# Patient Record
Sex: Male | Born: 1960
Health system: Southern US, Community
[De-identification: ages and names within clinical notes are randomized; demographics above are authoritative.]

## PROBLEM LIST (undated history)

## (undated) DIAGNOSIS — I10 Essential (primary) hypertension: Secondary | ICD-10-CM

## (undated) DIAGNOSIS — E559 Vitamin D deficiency, unspecified: Secondary | ICD-10-CM

## (undated) DIAGNOSIS — T7840XA Allergy, unspecified, initial encounter: Secondary | ICD-10-CM

## (undated) DIAGNOSIS — M1990 Unspecified osteoarthritis, unspecified site: Secondary | ICD-10-CM

## (undated) HISTORY — DX: Essential (primary) hypertension: I10

## (undated) HISTORY — DX: Allergy, unspecified, initial encounter: T78.40XA

## (undated) HISTORY — DX: Unspecified osteoarthritis, unspecified site: M19.90

## (undated) HISTORY — DX: Vitamin D deficiency, unspecified: E55.9

---

## 1998-02-28 ENCOUNTER — Ambulatory Visit (HOSPITAL_COMMUNITY): Admission: RE | Admit: 1998-02-28 | Discharge: 1998-02-28 | Payer: Self-pay | Admitting: Internal Medicine

## 2001-06-24 ENCOUNTER — Encounter: Payer: Self-pay | Admitting: Internal Medicine

## 2001-06-24 ENCOUNTER — Encounter: Admission: RE | Admit: 2001-06-24 | Discharge: 2001-06-24 | Payer: Self-pay | Admitting: Internal Medicine

## 2004-09-02 ENCOUNTER — Encounter: Admission: RE | Admit: 2004-09-02 | Discharge: 2004-09-02 | Payer: Self-pay | Admitting: Internal Medicine

## 2006-10-27 ENCOUNTER — Encounter: Admission: RE | Admit: 2006-10-27 | Discharge: 2006-10-27 | Payer: Self-pay | Admitting: Internal Medicine

## 2009-02-28 ENCOUNTER — Encounter: Admission: RE | Admit: 2009-02-28 | Discharge: 2009-02-28 | Payer: Self-pay | Admitting: Internal Medicine

## 2010-06-18 IMAGING — CR DG CERVICAL SPINE COMPLETE 4+V
6 series · 6 of 6 positions shown · non-contrast
Comparison: 09/02/2004.

CLINICAL DATA: Neck pain

CERVICAL SPINE - COMPLETE 4+ VIEW

[w c-spine lat]
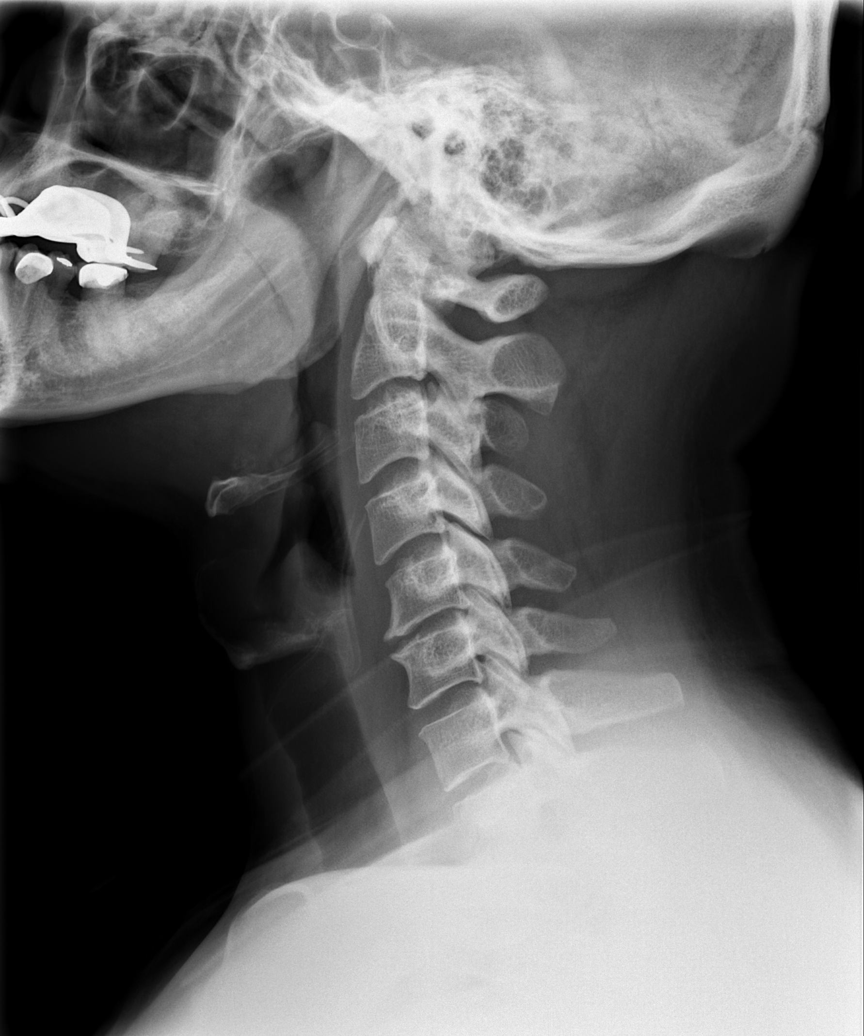

[w c-spine oblique (1 of 2)]
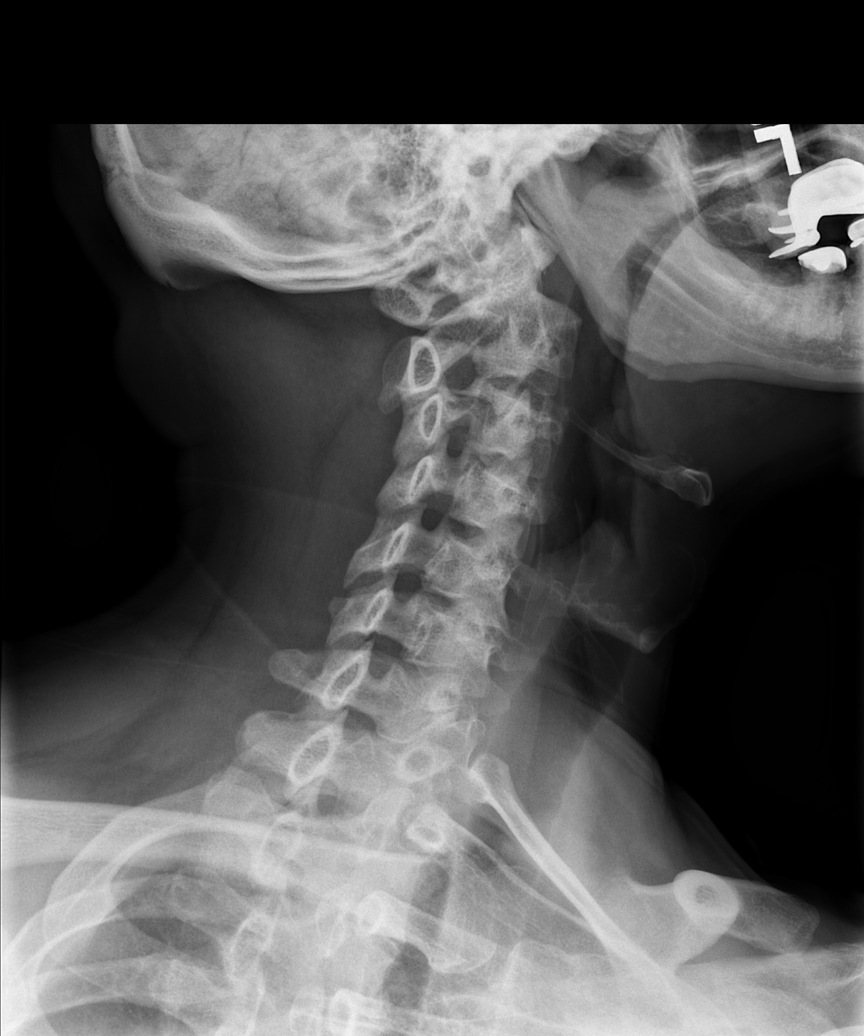

[w c-spine oblique (2 of 2)]
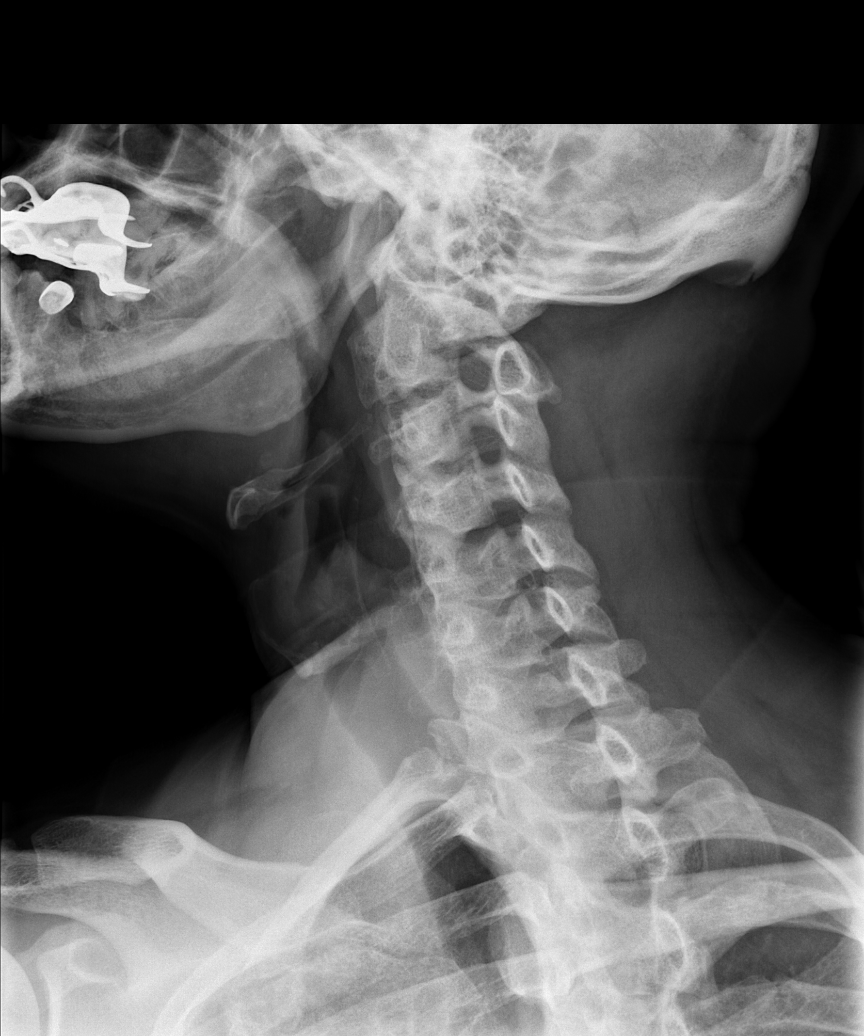

[w c-spine a.p. *]
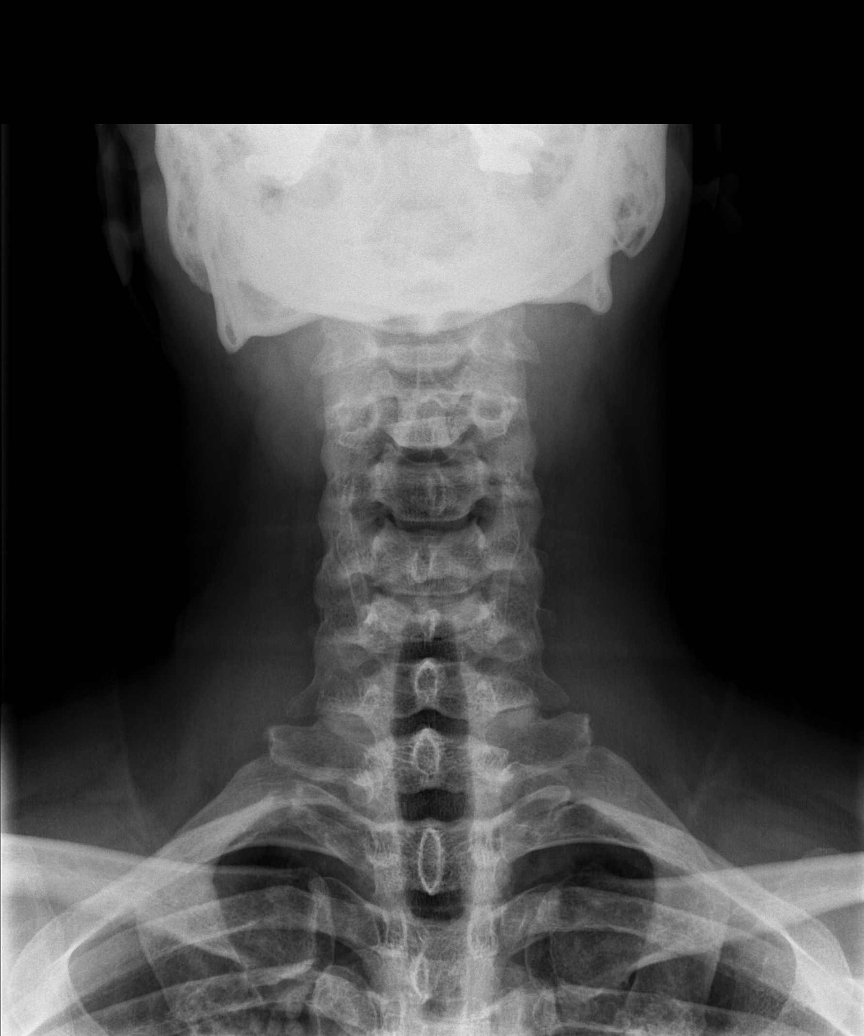

[w c-spine odontoid * (1 of 2)]
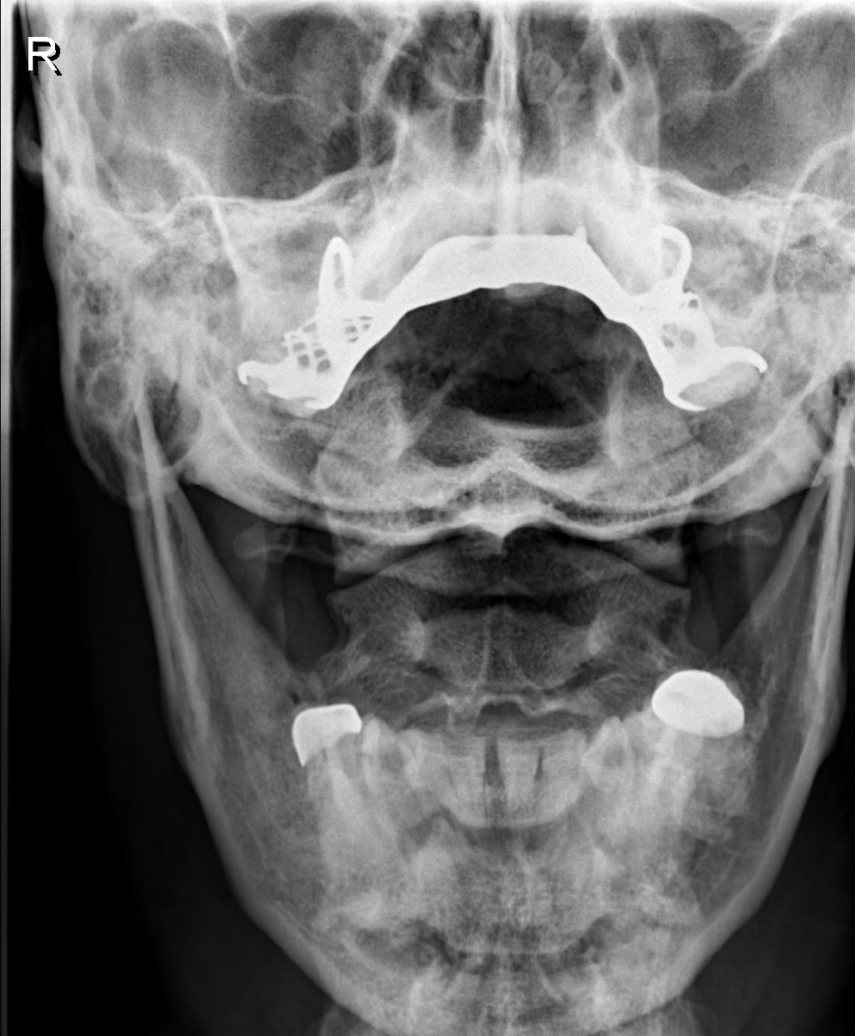

[w c-spine odontoid * (2 of 2)]
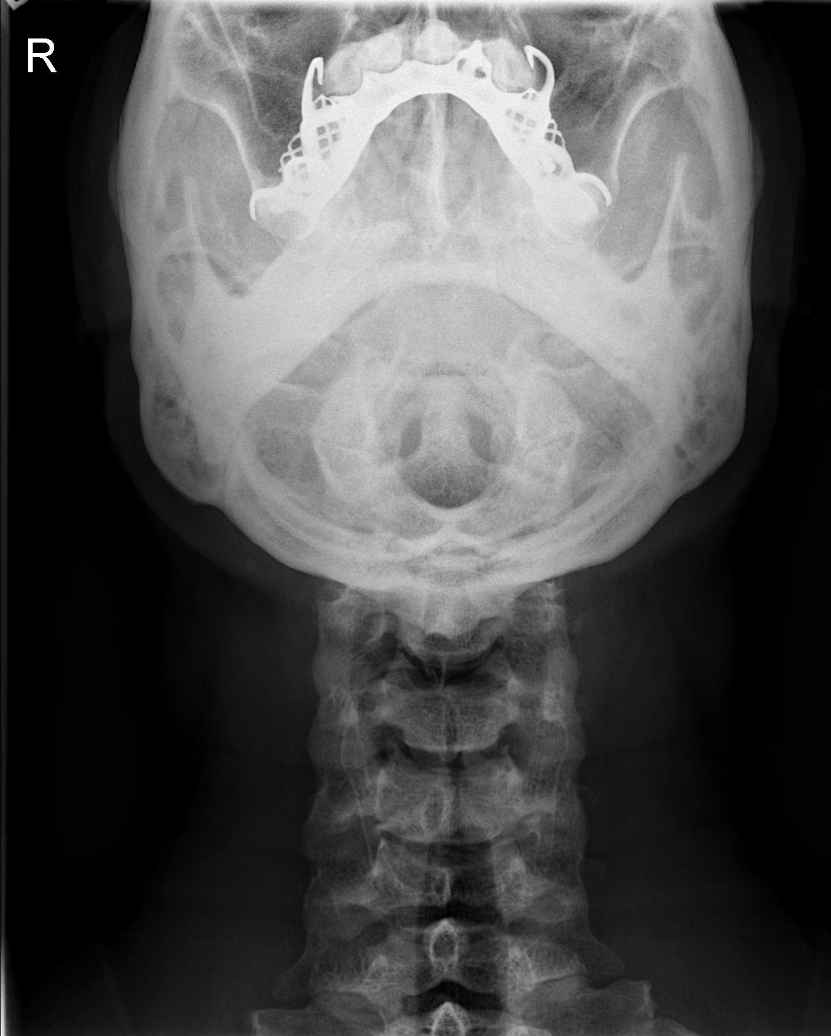

[6 of 6 positions shown; findings below may reference images not displayed]

FINDINGS: No evidence for fracture.  There is no subluxation.  Mild
loss of disc height is seen at C5-6 and there is some mild
associated endplate spurring.  The facets are well-aligned
bilaterally.  There is no prevertebral soft tissue swelling.
IMPRESSION: Mild degenerative changes at the C5-6 interspace.  Otherwise normal
exam.

## 2010-12-10 ENCOUNTER — Encounter: Payer: Self-pay | Admitting: Internal Medicine

## 2018-07-06 ENCOUNTER — Other Ambulatory Visit (HOSPITAL_BASED_OUTPATIENT_CLINIC_OR_DEPARTMENT_OTHER): Payer: Self-pay | Admitting: Internal Medicine

## 2018-07-06 ENCOUNTER — Ambulatory Visit (HOSPITAL_BASED_OUTPATIENT_CLINIC_OR_DEPARTMENT_OTHER)
Admission: RE | Admit: 2018-07-06 | Discharge: 2018-07-06 | Disposition: A | Discharge status: Home / Self Care | Payer: 59 | Source: Ambulatory Visit | Attending: Internal Medicine | Admitting: Internal Medicine

## 2018-07-06 DIAGNOSIS — M545 Low back pain, unspecified: Secondary | ICD-10-CM

## 2018-07-06 DIAGNOSIS — M5136 Other intervertebral disc degeneration, lumbar region: Secondary | ICD-10-CM | POA: Diagnosis not present

## 2020-08-06 ENCOUNTER — Ambulatory Visit: Payer: 59

## 2020-08-07 ENCOUNTER — Ambulatory Visit: Payer: No Typology Code available for payment source | Attending: Internal Medicine

## 2020-08-07 ENCOUNTER — Other Ambulatory Visit (HOSPITAL_BASED_OUTPATIENT_CLINIC_OR_DEPARTMENT_OTHER): Payer: Self-pay | Admitting: Internal Medicine

## 2020-08-07 DIAGNOSIS — Z23 Encounter for immunization: Secondary | ICD-10-CM

## 2020-08-07 NOTE — Progress Notes (Signed)
   Covid-[facility] Vaccination [facility]  Name:  Eddie Fernandez    MRN: [id number] DOB: Feb 09, 1961  08/07/2020  [name] was observed post Covid-19 immunization for 15 minutes without incident. He was provided with Vaccine Information Sheet and instruction to access the V-Safe system.   [name] was instructed to call 911 with any severe reactions post vaccine: Marland Kitchen Difficulty breathing  . Swelling of face and throat  . A fast heartbeat  . A bad rash all over body  . Dizziness and weakness   Immunizations Administered    Name Date Dose VIS Date Route   Pfizer COVID-19 Vaccine 08/07/2020  9:32 AM 0.3 mL 06/13/2020 Intramuscular   Manufacturer: [employer], [employer]   Lot: [id number]   NDC: [id number]

## 2020-08-13 MED FILL — PFIZER-BIONTECH COVID-19 VA: 30 | 1 days supply | Qty: 0 | Fill #0

## 2021-05-24 ENCOUNTER — Ambulatory Visit: Payer: No Typology Code available for payment source | Attending: Internal Medicine

## 2021-05-24 ENCOUNTER — Other Ambulatory Visit (HOSPITAL_BASED_OUTPATIENT_CLINIC_OR_DEPARTMENT_OTHER): Payer: Self-pay

## 2021-05-24 DIAGNOSIS — Z23 Encounter for immunization: Secondary | ICD-10-CM

## 2021-05-24 MED ORDER — INFLUENZA VAC SPLIT QUAD 0.5 ML IM SUSY
PREFILLED_SYRINGE | INTRAMUSCULAR | 0 refills | Status: AC
  Filled 2021-05-24: qty 0.5, 1d supply, fill #0

## 2021-05-24 NOTE — Progress Notes (Signed)
   Covid-19 Vaccination Clinic  Name:  Eddie Fernandez    MRN: [id number] DOB: 11-25-1960  05/24/2021  Eddie Fernandez was observed post Covid-19 immunization for 15 minutes without incident. He was provided with Vaccine Information Sheet and instruction to access the V-Safe system.   Eddie Fernandez was instructed to call 911 with any severe reactions post vaccine: Difficulty breathing  Swelling of face and throat  A fast heartbeat  A bad rash all over body  Dizziness and weakness

## 2021-06-04 ENCOUNTER — Other Ambulatory Visit (HOSPITAL_BASED_OUTPATIENT_CLINIC_OR_DEPARTMENT_OTHER): Payer: Self-pay

## 2021-06-04 MED ORDER — COVID-19MRNA BIVAL VACC PFIZER 30 MCG/0.3ML IM SUSP
INTRAMUSCULAR | 0 refills | Status: AC
  Filled 2021-06-04: qty 0.3, 1d supply, fill #0

## 2022-08-01 ENCOUNTER — Other Ambulatory Visit (HOSPITAL_BASED_OUTPATIENT_CLINIC_OR_DEPARTMENT_OTHER): Payer: Self-pay

## 2022-08-01 MED ORDER — COMIRNATY 30 MCG/0.3ML IM SUSY
PREFILLED_SYRINGE | INTRAMUSCULAR | 0 refills | Status: AC
  Filled 2022-08-01: qty 0.3, 1d supply, fill #0

## 2022-08-01 MED ORDER — FLUARIX QUADRIVALENT 0.5 ML IM SUSY
PREFILLED_SYRINGE | INTRAMUSCULAR | 0 refills | Status: AC
  Filled 2022-08-01: qty 0.5, 1d supply, fill #0

## 2022-08-04 DIAGNOSIS — F5221 Male erectile disorder: Secondary | ICD-10-CM | POA: Diagnosis not present

## 2022-08-04 DIAGNOSIS — I10 Essential (primary) hypertension: Secondary | ICD-10-CM | POA: Diagnosis not present

## 2022-08-04 DIAGNOSIS — E1165 Type 2 diabetes mellitus with hyperglycemia: Secondary | ICD-10-CM | POA: Diagnosis not present

## 2022-08-04 DIAGNOSIS — J301 Allergic rhinitis due to pollen: Secondary | ICD-10-CM | POA: Diagnosis not present

## 2022-08-04 DIAGNOSIS — E782 Mixed hyperlipidemia: Secondary | ICD-10-CM | POA: Diagnosis not present

## 2022-08-14 DIAGNOSIS — H524 Presbyopia: Secondary | ICD-10-CM | POA: Diagnosis not present

## 2022-08-14 DIAGNOSIS — E119 Type 2 diabetes mellitus without complications: Secondary | ICD-10-CM | POA: Diagnosis not present

## 2022-08-14 DIAGNOSIS — H04123 Dry eye syndrome of bilateral lacrimal glands: Secondary | ICD-10-CM | POA: Diagnosis not present

## 2022-08-14 DIAGNOSIS — H25813 Combined forms of age-related cataract, bilateral: Secondary | ICD-10-CM | POA: Diagnosis not present

## 2023-01-06 DIAGNOSIS — E1165 Type 2 diabetes mellitus with hyperglycemia: Secondary | ICD-10-CM | POA: Diagnosis not present

## 2023-01-06 DIAGNOSIS — F5221 Male erectile disorder: Secondary | ICD-10-CM | POA: Diagnosis not present

## 2023-01-06 DIAGNOSIS — E782 Mixed hyperlipidemia: Secondary | ICD-10-CM | POA: Diagnosis not present

## 2023-01-06 DIAGNOSIS — J301 Allergic rhinitis due to pollen: Secondary | ICD-10-CM | POA: Diagnosis not present

## 2023-01-06 DIAGNOSIS — I119 Hypertensive heart disease without heart failure: Secondary | ICD-10-CM | POA: Diagnosis not present

## 2023-02-16 DIAGNOSIS — E1165 Type 2 diabetes mellitus with hyperglycemia: Secondary | ICD-10-CM | POA: Diagnosis not present

## 2023-02-16 DIAGNOSIS — I119 Hypertensive heart disease without heart failure: Secondary | ICD-10-CM | POA: Diagnosis not present

## 2023-02-16 DIAGNOSIS — E782 Mixed hyperlipidemia: Secondary | ICD-10-CM | POA: Diagnosis not present

## 2023-02-16 DIAGNOSIS — F5221 Male erectile disorder: Secondary | ICD-10-CM | POA: Diagnosis not present

## 2023-04-21 DIAGNOSIS — E1165 Type 2 diabetes mellitus with hyperglycemia: Secondary | ICD-10-CM | POA: Diagnosis not present

## 2023-04-21 DIAGNOSIS — I119 Hypertensive heart disease without heart failure: Secondary | ICD-10-CM | POA: Diagnosis not present

## 2023-04-21 DIAGNOSIS — Z5181 Encounter for therapeutic drug level monitoring: Secondary | ICD-10-CM | POA: Diagnosis not present

## 2023-05-19 DIAGNOSIS — Z23 Encounter for immunization: Secondary | ICD-10-CM | POA: Diagnosis not present

## 2023-05-19 DIAGNOSIS — E782 Mixed hyperlipidemia: Secondary | ICD-10-CM | POA: Diagnosis not present

## 2023-05-19 DIAGNOSIS — F5221 Male erectile disorder: Secondary | ICD-10-CM | POA: Diagnosis not present

## 2023-05-19 DIAGNOSIS — I119 Hypertensive heart disease without heart failure: Secondary | ICD-10-CM | POA: Diagnosis not present

## 2023-05-19 DIAGNOSIS — E1165 Type 2 diabetes mellitus with hyperglycemia: Secondary | ICD-10-CM | POA: Diagnosis not present

## 2023-09-08 DIAGNOSIS — I119 Hypertensive heart disease without heart failure: Secondary | ICD-10-CM | POA: Diagnosis not present

## 2023-09-08 DIAGNOSIS — E782 Mixed hyperlipidemia: Secondary | ICD-10-CM | POA: Diagnosis not present

## 2023-09-08 DIAGNOSIS — E1165 Type 2 diabetes mellitus with hyperglycemia: Secondary | ICD-10-CM | POA: Diagnosis not present

## 2023-09-15 DIAGNOSIS — I119 Hypertensive heart disease without heart failure: Secondary | ICD-10-CM | POA: Diagnosis not present

## 2023-09-15 DIAGNOSIS — H672 Otitis media in diseases classified elsewhere, left ear: Secondary | ICD-10-CM | POA: Diagnosis not present

## 2023-09-15 DIAGNOSIS — E782 Mixed hyperlipidemia: Secondary | ICD-10-CM | POA: Diagnosis not present

## 2023-09-15 DIAGNOSIS — M654 Radial styloid tenosynovitis [de Quervain]: Secondary | ICD-10-CM | POA: Diagnosis not present

## 2023-09-15 DIAGNOSIS — E1165 Type 2 diabetes mellitus with hyperglycemia: Secondary | ICD-10-CM | POA: Diagnosis not present

## 2023-09-28 DIAGNOSIS — H1045 Other chronic allergic conjunctivitis: Secondary | ICD-10-CM | POA: Diagnosis not present

## 2023-09-28 DIAGNOSIS — E119 Type 2 diabetes mellitus without complications: Secondary | ICD-10-CM | POA: Diagnosis not present

## 2023-09-28 DIAGNOSIS — H25813 Combined forms of age-related cataract, bilateral: Secondary | ICD-10-CM | POA: Diagnosis not present

## 2023-09-28 DIAGNOSIS — H04123 Dry eye syndrome of bilateral lacrimal glands: Secondary | ICD-10-CM | POA: Diagnosis not present

## 2023-10-27 DIAGNOSIS — I119 Hypertensive heart disease without heart failure: Secondary | ICD-10-CM | POA: Diagnosis not present

## 2023-10-27 DIAGNOSIS — M654 Radial styloid tenosynovitis [de Quervain]: Secondary | ICD-10-CM | POA: Diagnosis not present

## 2023-10-27 DIAGNOSIS — J301 Allergic rhinitis due to pollen: Secondary | ICD-10-CM | POA: Diagnosis not present

## 2023-10-27 DIAGNOSIS — E1165 Type 2 diabetes mellitus with hyperglycemia: Secondary | ICD-10-CM | POA: Diagnosis not present

## 2024-03-30 DIAGNOSIS — I119 Hypertensive heart disease without heart failure: Secondary | ICD-10-CM | POA: Diagnosis not present

## 2024-03-30 DIAGNOSIS — E1165 Type 2 diabetes mellitus with hyperglycemia: Secondary | ICD-10-CM | POA: Diagnosis not present

## 2024-03-30 DIAGNOSIS — E782 Mixed hyperlipidemia: Secondary | ICD-10-CM | POA: Diagnosis not present

## 2024-03-30 DIAGNOSIS — D649 Anemia, unspecified: Secondary | ICD-10-CM | POA: Diagnosis not present

## 2024-03-30 DIAGNOSIS — Z125 Encounter for screening for malignant neoplasm of prostate: Secondary | ICD-10-CM | POA: Diagnosis not present

## 2024-04-28 DIAGNOSIS — E782 Mixed hyperlipidemia: Secondary | ICD-10-CM | POA: Diagnosis not present

## 2024-04-28 DIAGNOSIS — E1165 Type 2 diabetes mellitus with hyperglycemia: Secondary | ICD-10-CM | POA: Diagnosis not present

## 2024-04-28 DIAGNOSIS — J301 Allergic rhinitis due to pollen: Secondary | ICD-10-CM | POA: Diagnosis not present

## 2024-04-28 DIAGNOSIS — L501 Idiopathic urticaria: Secondary | ICD-10-CM | POA: Diagnosis not present

## 2024-04-28 DIAGNOSIS — I119 Hypertensive heart disease without heart failure: Secondary | ICD-10-CM | POA: Diagnosis not present

## 2024-07-05 DIAGNOSIS — E782 Mixed hyperlipidemia: Secondary | ICD-10-CM | POA: Diagnosis not present

## 2024-07-05 DIAGNOSIS — L501 Idiopathic urticaria: Secondary | ICD-10-CM | POA: Diagnosis not present

## 2024-07-05 DIAGNOSIS — J301 Allergic rhinitis due to pollen: Secondary | ICD-10-CM | POA: Diagnosis not present

## 2024-07-05 DIAGNOSIS — I119 Hypertensive heart disease without heart failure: Secondary | ICD-10-CM | POA: Diagnosis not present

## 2024-07-05 DIAGNOSIS — E1165 Type 2 diabetes mellitus with hyperglycemia: Secondary | ICD-10-CM | POA: Diagnosis not present

## 2024-09-07 ENCOUNTER — Encounter: Payer: Self-pay | Admitting: *Deleted

## 2024-09-07 NOTE — Progress Notes (Signed)
 Eddie Fernandez                                          MRN: [id number]   09/07/2024   The [facility] Quality Team Specialist reviewed this patient medical record for the purposes of chart review for care gap closure. The following were reviewed: chart review for care gap closure-colorectal cancer screening.    [facility] Quality Team
